# Patient Record
Sex: Female | Born: 1935 | Race: White | Hispanic: No
Health system: Southern US, Community
[De-identification: ages and names within clinical notes are randomized; demographics above are authoritative.]

## PROBLEM LIST (undated history)

## (undated) DIAGNOSIS — I4891 Unspecified atrial fibrillation: Secondary | ICD-10-CM

## (undated) DIAGNOSIS — I10 Essential (primary) hypertension: Secondary | ICD-10-CM

## (undated) DIAGNOSIS — H3530 Unspecified macular degeneration: Secondary | ICD-10-CM

## (undated) DIAGNOSIS — D649 Anemia, unspecified: Secondary | ICD-10-CM

## (undated) HISTORY — PX: MASTECTOMY: SHX3

## (undated) HISTORY — PX: BREAST SURGERY: SHX581

---

## 2014-10-09 ENCOUNTER — Ambulatory Visit: Payer: Self-pay | Admitting: Family

## 2014-11-23 ENCOUNTER — Ambulatory Visit: Payer: Self-pay | Admitting: Family

## 2014-12-02 ENCOUNTER — Ambulatory Visit: Payer: Self-pay | Admitting: Family Medicine

## 2014-12-10 ENCOUNTER — Other Ambulatory Visit: Payer: Self-pay | Admitting: Internal Medicine

## 2014-12-10 DIAGNOSIS — R600 Localized edema: Secondary | ICD-10-CM

## 2014-12-11 ENCOUNTER — Other Ambulatory Visit: Payer: Self-pay

## 2014-12-11 ENCOUNTER — Ambulatory Visit
Admission: RE | Admit: 2014-12-11 | Discharge: 2014-12-11 | Disposition: A | Discharge status: Home / Self Care | Payer: Medicare HMO | Source: Ambulatory Visit | Attending: Internal Medicine | Admitting: Internal Medicine

## 2014-12-11 DIAGNOSIS — R600 Localized edema: Secondary | ICD-10-CM

## 2015-07-05 DIAGNOSIS — Z23 Encounter for immunization: Secondary | ICD-10-CM | POA: Diagnosis not present

## 2015-07-09 DIAGNOSIS — G4730 Sleep apnea, unspecified: Secondary | ICD-10-CM | POA: Diagnosis not present

## 2015-07-09 DIAGNOSIS — I10 Essential (primary) hypertension: Secondary | ICD-10-CM | POA: Diagnosis not present

## 2015-07-09 DIAGNOSIS — E7800 Pure hypercholesterolemia, unspecified: Secondary | ICD-10-CM | POA: Diagnosis not present

## 2015-07-09 DIAGNOSIS — G4733 Obstructive sleep apnea (adult) (pediatric): Secondary | ICD-10-CM | POA: Diagnosis not present

## 2015-07-30 DIAGNOSIS — H353133 Nonexudative age-related macular degeneration, bilateral, advanced atrophic without subfoveal involvement: Secondary | ICD-10-CM | POA: Diagnosis not present

## 2015-07-30 DIAGNOSIS — H43813 Vitreous degeneration, bilateral: Secondary | ICD-10-CM | POA: Diagnosis not present

## 2015-08-06 DIAGNOSIS — G4733 Obstructive sleep apnea (adult) (pediatric): Secondary | ICD-10-CM | POA: Diagnosis not present

## 2015-08-09 DIAGNOSIS — G4730 Sleep apnea, unspecified: Secondary | ICD-10-CM | POA: Diagnosis not present

## 2015-08-09 DIAGNOSIS — I10 Essential (primary) hypertension: Secondary | ICD-10-CM | POA: Diagnosis not present

## 2015-08-09 DIAGNOSIS — G4733 Obstructive sleep apnea (adult) (pediatric): Secondary | ICD-10-CM | POA: Diagnosis not present

## 2015-08-09 DIAGNOSIS — E7800 Pure hypercholesterolemia, unspecified: Secondary | ICD-10-CM | POA: Diagnosis not present

## 2015-09-08 DIAGNOSIS — E7800 Pure hypercholesterolemia, unspecified: Secondary | ICD-10-CM | POA: Diagnosis not present

## 2015-09-08 DIAGNOSIS — I10 Essential (primary) hypertension: Secondary | ICD-10-CM | POA: Diagnosis not present

## 2015-09-08 DIAGNOSIS — G4730 Sleep apnea, unspecified: Secondary | ICD-10-CM | POA: Diagnosis not present

## 2015-09-08 DIAGNOSIS — G4733 Obstructive sleep apnea (adult) (pediatric): Secondary | ICD-10-CM | POA: Diagnosis not present

## 2015-10-06 DIAGNOSIS — H353130 Nonexudative age-related macular degeneration, bilateral, stage unspecified: Secondary | ICD-10-CM | POA: Diagnosis not present

## 2015-10-06 DIAGNOSIS — H5372 Impaired contrast sensitivity: Secondary | ICD-10-CM | POA: Diagnosis not present

## 2015-10-06 DIAGNOSIS — H53419 Scotoma involving central area, unspecified eye: Secondary | ICD-10-CM | POA: Diagnosis not present

## 2015-10-09 DIAGNOSIS — I10 Essential (primary) hypertension: Secondary | ICD-10-CM | POA: Diagnosis not present

## 2015-10-09 DIAGNOSIS — G4730 Sleep apnea, unspecified: Secondary | ICD-10-CM | POA: Diagnosis not present

## 2015-10-09 DIAGNOSIS — E7800 Pure hypercholesterolemia, unspecified: Secondary | ICD-10-CM | POA: Diagnosis not present

## 2015-10-09 DIAGNOSIS — G4733 Obstructive sleep apnea (adult) (pediatric): Secondary | ICD-10-CM | POA: Diagnosis not present

## 2015-10-12 DIAGNOSIS — H353130 Nonexudative age-related macular degeneration, bilateral, stage unspecified: Secondary | ICD-10-CM | POA: Diagnosis not present

## 2015-10-12 DIAGNOSIS — H5372 Impaired contrast sensitivity: Secondary | ICD-10-CM | POA: Diagnosis not present

## 2015-10-12 DIAGNOSIS — H53419 Scotoma involving central area, unspecified eye: Secondary | ICD-10-CM | POA: Diagnosis not present

## 2015-10-27 DIAGNOSIS — H5372 Impaired contrast sensitivity: Secondary | ICD-10-CM | POA: Diagnosis not present

## 2015-10-27 DIAGNOSIS — H353130 Nonexudative age-related macular degeneration, bilateral, stage unspecified: Secondary | ICD-10-CM | POA: Diagnosis not present

## 2015-10-27 DIAGNOSIS — H53419 Scotoma involving central area, unspecified eye: Secondary | ICD-10-CM | POA: Diagnosis not present

## 2015-11-09 DIAGNOSIS — G4733 Obstructive sleep apnea (adult) (pediatric): Secondary | ICD-10-CM | POA: Diagnosis not present

## 2015-11-09 DIAGNOSIS — E7800 Pure hypercholesterolemia, unspecified: Secondary | ICD-10-CM | POA: Diagnosis not present

## 2015-11-09 DIAGNOSIS — I10 Essential (primary) hypertension: Secondary | ICD-10-CM | POA: Diagnosis not present

## 2015-11-09 DIAGNOSIS — G4730 Sleep apnea, unspecified: Secondary | ICD-10-CM | POA: Diagnosis not present

## 2015-11-10 DIAGNOSIS — H5372 Impaired contrast sensitivity: Secondary | ICD-10-CM | POA: Diagnosis not present

## 2015-11-10 DIAGNOSIS — H53419 Scotoma involving central area, unspecified eye: Secondary | ICD-10-CM | POA: Diagnosis not present

## 2015-11-10 DIAGNOSIS — H353130 Nonexudative age-related macular degeneration, bilateral, stage unspecified: Secondary | ICD-10-CM | POA: Diagnosis not present

## 2015-11-15 DIAGNOSIS — H53419 Scotoma involving central area, unspecified eye: Secondary | ICD-10-CM | POA: Diagnosis not present

## 2015-11-15 DIAGNOSIS — H353130 Nonexudative age-related macular degeneration, bilateral, stage unspecified: Secondary | ICD-10-CM | POA: Diagnosis not present

## 2015-11-15 DIAGNOSIS — H5372 Impaired contrast sensitivity: Secondary | ICD-10-CM | POA: Diagnosis not present

## 2015-11-23 DIAGNOSIS — G4733 Obstructive sleep apnea (adult) (pediatric): Secondary | ICD-10-CM | POA: Diagnosis not present

## 2015-11-24 DIAGNOSIS — H5372 Impaired contrast sensitivity: Secondary | ICD-10-CM | POA: Diagnosis not present

## 2015-11-24 DIAGNOSIS — H53419 Scotoma involving central area, unspecified eye: Secondary | ICD-10-CM | POA: Diagnosis not present

## 2015-11-24 DIAGNOSIS — H353130 Nonexudative age-related macular degeneration, bilateral, stage unspecified: Secondary | ICD-10-CM | POA: Diagnosis not present

## 2015-12-07 DIAGNOSIS — E7800 Pure hypercholesterolemia, unspecified: Secondary | ICD-10-CM | POA: Diagnosis not present

## 2015-12-07 DIAGNOSIS — G4733 Obstructive sleep apnea (adult) (pediatric): Secondary | ICD-10-CM | POA: Diagnosis not present

## 2015-12-07 DIAGNOSIS — G4730 Sleep apnea, unspecified: Secondary | ICD-10-CM | POA: Diagnosis not present

## 2015-12-07 DIAGNOSIS — I10 Essential (primary) hypertension: Secondary | ICD-10-CM | POA: Diagnosis not present

## 2015-12-09 DIAGNOSIS — I2510 Atherosclerotic heart disease of native coronary artery without angina pectoris: Secondary | ICD-10-CM | POA: Diagnosis not present

## 2015-12-09 DIAGNOSIS — I5030 Unspecified diastolic (congestive) heart failure: Secondary | ICD-10-CM | POA: Diagnosis not present

## 2015-12-09 DIAGNOSIS — I10 Essential (primary) hypertension: Secondary | ICD-10-CM | POA: Diagnosis not present

## 2015-12-09 DIAGNOSIS — K589 Irritable bowel syndrome without diarrhea: Secondary | ICD-10-CM | POA: Diagnosis not present

## 2015-12-09 DIAGNOSIS — E7800 Pure hypercholesterolemia, unspecified: Secondary | ICD-10-CM | POA: Diagnosis not present

## 2015-12-09 DIAGNOSIS — G4730 Sleep apnea, unspecified: Secondary | ICD-10-CM | POA: Diagnosis not present

## 2015-12-13 DIAGNOSIS — R109 Unspecified abdominal pain: Secondary | ICD-10-CM | POA: Diagnosis not present

## 2015-12-20 DIAGNOSIS — R42 Dizziness and giddiness: Secondary | ICD-10-CM | POA: Diagnosis not present

## 2015-12-20 DIAGNOSIS — I2510 Atherosclerotic heart disease of native coronary artery without angina pectoris: Secondary | ICD-10-CM | POA: Diagnosis not present

## 2015-12-20 DIAGNOSIS — I10 Essential (primary) hypertension: Secondary | ICD-10-CM | POA: Diagnosis not present

## 2015-12-20 DIAGNOSIS — G4730 Sleep apnea, unspecified: Secondary | ICD-10-CM | POA: Diagnosis not present

## 2016-01-07 DIAGNOSIS — I10 Essential (primary) hypertension: Secondary | ICD-10-CM | POA: Diagnosis not present

## 2016-01-07 DIAGNOSIS — E7800 Pure hypercholesterolemia, unspecified: Secondary | ICD-10-CM | POA: Diagnosis not present

## 2016-01-07 DIAGNOSIS — G4730 Sleep apnea, unspecified: Secondary | ICD-10-CM | POA: Diagnosis not present

## 2016-01-07 DIAGNOSIS — G4733 Obstructive sleep apnea (adult) (pediatric): Secondary | ICD-10-CM | POA: Diagnosis not present

## 2016-01-20 DIAGNOSIS — H353133 Nonexudative age-related macular degeneration, bilateral, advanced atrophic without subfoveal involvement: Secondary | ICD-10-CM | POA: Diagnosis not present

## 2016-05-04 DIAGNOSIS — G4733 Obstructive sleep apnea (adult) (pediatric): Secondary | ICD-10-CM | POA: Diagnosis not present

## 2016-06-11 DIAGNOSIS — Z23 Encounter for immunization: Secondary | ICD-10-CM | POA: Diagnosis not present

## 2016-06-15 DIAGNOSIS — I2510 Atherosclerotic heart disease of native coronary artery without angina pectoris: Secondary | ICD-10-CM | POA: Diagnosis not present

## 2016-06-15 DIAGNOSIS — H3530 Unspecified macular degeneration: Secondary | ICD-10-CM | POA: Diagnosis not present

## 2016-06-15 DIAGNOSIS — I5030 Unspecified diastolic (congestive) heart failure: Secondary | ICD-10-CM | POA: Diagnosis not present

## 2016-06-15 DIAGNOSIS — Z1389 Encounter for screening for other disorder: Secondary | ICD-10-CM | POA: Diagnosis not present

## 2016-06-15 DIAGNOSIS — Z0000 Encounter for general adult medical examination without abnormal findings: Secondary | ICD-10-CM | POA: Diagnosis not present

## 2016-06-15 DIAGNOSIS — G4730 Sleep apnea, unspecified: Secondary | ICD-10-CM | POA: Diagnosis not present

## 2016-06-15 DIAGNOSIS — E7800 Pure hypercholesterolemia, unspecified: Secondary | ICD-10-CM | POA: Diagnosis not present

## 2016-06-15 DIAGNOSIS — M8588 Other specified disorders of bone density and structure, other site: Secondary | ICD-10-CM | POA: Diagnosis not present

## 2016-06-15 DIAGNOSIS — I10 Essential (primary) hypertension: Secondary | ICD-10-CM | POA: Diagnosis not present

## 2016-07-04 DIAGNOSIS — H52221 Regular astigmatism, right eye: Secondary | ICD-10-CM | POA: Diagnosis not present

## 2016-07-04 DIAGNOSIS — H35371 Puckering of macula, right eye: Secondary | ICD-10-CM | POA: Diagnosis not present

## 2016-07-04 DIAGNOSIS — H26491 Other secondary cataract, right eye: Secondary | ICD-10-CM | POA: Diagnosis not present

## 2016-07-04 DIAGNOSIS — H52222 Regular astigmatism, left eye: Secondary | ICD-10-CM | POA: Diagnosis not present

## 2016-07-04 DIAGNOSIS — H353134 Nonexudative age-related macular degeneration, bilateral, advanced atrophic with subfoveal involvement: Secondary | ICD-10-CM | POA: Diagnosis not present

## 2016-07-04 DIAGNOSIS — H524 Presbyopia: Secondary | ICD-10-CM | POA: Diagnosis not present

## 2016-07-04 DIAGNOSIS — H04123 Dry eye syndrome of bilateral lacrimal glands: Secondary | ICD-10-CM | POA: Diagnosis not present

## 2016-07-04 DIAGNOSIS — H5211 Myopia, right eye: Secondary | ICD-10-CM | POA: Diagnosis not present

## 2016-07-18 DIAGNOSIS — H353123 Nonexudative age-related macular degeneration, left eye, advanced atrophic without subfoveal involvement: Secondary | ICD-10-CM | POA: Diagnosis not present

## 2016-07-18 DIAGNOSIS — H43392 Other vitreous opacities, left eye: Secondary | ICD-10-CM | POA: Diagnosis not present

## 2016-07-18 DIAGNOSIS — H35433 Paving stone degeneration of retina, bilateral: Secondary | ICD-10-CM | POA: Diagnosis not present

## 2016-07-18 DIAGNOSIS — H353114 Nonexudative age-related macular degeneration, right eye, advanced atrophic with subfoveal involvement: Secondary | ICD-10-CM | POA: Diagnosis not present

## 2016-08-08 DIAGNOSIS — I10 Essential (primary) hypertension: Secondary | ICD-10-CM | POA: Diagnosis not present

## 2016-08-08 DIAGNOSIS — E7800 Pure hypercholesterolemia, unspecified: Secondary | ICD-10-CM | POA: Diagnosis not present

## 2016-08-08 DIAGNOSIS — G4730 Sleep apnea, unspecified: Secondary | ICD-10-CM | POA: Diagnosis not present

## 2016-08-08 DIAGNOSIS — G4733 Obstructive sleep apnea (adult) (pediatric): Secondary | ICD-10-CM | POA: Diagnosis not present

## 2016-11-07 DIAGNOSIS — G4733 Obstructive sleep apnea (adult) (pediatric): Secondary | ICD-10-CM | POA: Diagnosis not present

## 2016-11-13 DIAGNOSIS — I10 Essential (primary) hypertension: Secondary | ICD-10-CM | POA: Diagnosis not present

## 2016-11-13 DIAGNOSIS — J309 Allergic rhinitis, unspecified: Secondary | ICD-10-CM | POA: Diagnosis not present

## 2016-11-13 DIAGNOSIS — H3530 Unspecified macular degeneration: Secondary | ICD-10-CM | POA: Diagnosis not present

## 2016-11-13 DIAGNOSIS — Z0000 Encounter for general adult medical examination without abnormal findings: Secondary | ICD-10-CM | POA: Diagnosis not present

## 2016-11-13 DIAGNOSIS — Z6823 Body mass index (BMI) 23.0-23.9, adult: Secondary | ICD-10-CM | POA: Diagnosis not present

## 2016-11-13 DIAGNOSIS — Z596 Low income: Secondary | ICD-10-CM | POA: Diagnosis not present

## 2016-11-13 DIAGNOSIS — D0592 Unspecified type of carcinoma in situ of left breast: Secondary | ICD-10-CM | POA: Diagnosis not present

## 2016-11-13 DIAGNOSIS — E785 Hyperlipidemia, unspecified: Secondary | ICD-10-CM | POA: Diagnosis not present

## 2016-11-13 DIAGNOSIS — R197 Diarrhea, unspecified: Secondary | ICD-10-CM | POA: Diagnosis not present

## 2016-11-13 DIAGNOSIS — Z7982 Long term (current) use of aspirin: Secondary | ICD-10-CM | POA: Diagnosis not present

## 2017-01-18 DIAGNOSIS — H353123 Nonexudative age-related macular degeneration, left eye, advanced atrophic without subfoveal involvement: Secondary | ICD-10-CM | POA: Diagnosis not present

## 2017-03-08 DIAGNOSIS — E7800 Pure hypercholesterolemia, unspecified: Secondary | ICD-10-CM | POA: Diagnosis not present

## 2017-03-08 DIAGNOSIS — I10 Essential (primary) hypertension: Secondary | ICD-10-CM | POA: Diagnosis not present

## 2017-03-08 DIAGNOSIS — G4733 Obstructive sleep apnea (adult) (pediatric): Secondary | ICD-10-CM | POA: Diagnosis not present

## 2017-03-08 DIAGNOSIS — G4730 Sleep apnea, unspecified: Secondary | ICD-10-CM | POA: Diagnosis not present

## 2017-05-09 DIAGNOSIS — G4733 Obstructive sleep apnea (adult) (pediatric): Secondary | ICD-10-CM | POA: Diagnosis not present

## 2017-06-25 DIAGNOSIS — Z0000 Encounter for general adult medical examination without abnormal findings: Secondary | ICD-10-CM | POA: Diagnosis not present

## 2017-06-25 DIAGNOSIS — Z853 Personal history of malignant neoplasm of breast: Secondary | ICD-10-CM | POA: Diagnosis not present

## 2017-06-25 DIAGNOSIS — M6530 Trigger finger, unspecified finger: Secondary | ICD-10-CM | POA: Diagnosis not present

## 2017-06-25 DIAGNOSIS — I5030 Unspecified diastolic (congestive) heart failure: Secondary | ICD-10-CM | POA: Diagnosis not present

## 2017-06-25 DIAGNOSIS — Z23 Encounter for immunization: Secondary | ICD-10-CM | POA: Diagnosis not present

## 2017-06-25 DIAGNOSIS — G4730 Sleep apnea, unspecified: Secondary | ICD-10-CM | POA: Diagnosis not present

## 2017-06-25 DIAGNOSIS — I10 Essential (primary) hypertension: Secondary | ICD-10-CM | POA: Diagnosis not present

## 2017-06-25 DIAGNOSIS — Z1389 Encounter for screening for other disorder: Secondary | ICD-10-CM | POA: Diagnosis not present

## 2017-06-25 DIAGNOSIS — E7800 Pure hypercholesterolemia, unspecified: Secondary | ICD-10-CM | POA: Diagnosis not present

## 2017-06-25 DIAGNOSIS — I2510 Atherosclerotic heart disease of native coronary artery without angina pectoris: Secondary | ICD-10-CM | POA: Diagnosis not present

## 2017-06-28 DIAGNOSIS — M65342 Trigger finger, left ring finger: Secondary | ICD-10-CM | POA: Diagnosis not present

## 2017-06-28 DIAGNOSIS — M79642 Pain in left hand: Secondary | ICD-10-CM | POA: Diagnosis not present

## 2017-07-11 DIAGNOSIS — M65342 Trigger finger, left ring finger: Secondary | ICD-10-CM | POA: Diagnosis not present

## 2017-07-17 DIAGNOSIS — H353133 Nonexudative age-related macular degeneration, bilateral, advanced atrophic without subfoveal involvement: Secondary | ICD-10-CM | POA: Diagnosis not present

## 2017-07-17 DIAGNOSIS — H43813 Vitreous degeneration, bilateral: Secondary | ICD-10-CM | POA: Diagnosis not present

## 2017-07-17 DIAGNOSIS — H43393 Other vitreous opacities, bilateral: Secondary | ICD-10-CM | POA: Diagnosis not present

## 2017-07-17 DIAGNOSIS — H35433 Paving stone degeneration of retina, bilateral: Secondary | ICD-10-CM | POA: Diagnosis not present

## 2017-09-07 DIAGNOSIS — G4730 Sleep apnea, unspecified: Secondary | ICD-10-CM | POA: Diagnosis not present

## 2017-09-07 DIAGNOSIS — I10 Essential (primary) hypertension: Secondary | ICD-10-CM | POA: Diagnosis not present

## 2017-09-07 DIAGNOSIS — E7800 Pure hypercholesterolemia, unspecified: Secondary | ICD-10-CM | POA: Diagnosis not present

## 2017-09-07 DIAGNOSIS — G4733 Obstructive sleep apnea (adult) (pediatric): Secondary | ICD-10-CM | POA: Diagnosis not present

## 2017-11-13 DIAGNOSIS — G4733 Obstructive sleep apnea (adult) (pediatric): Secondary | ICD-10-CM | POA: Diagnosis not present

## 2018-01-08 DIAGNOSIS — H26491 Other secondary cataract, right eye: Secondary | ICD-10-CM | POA: Diagnosis not present

## 2018-01-08 DIAGNOSIS — H35371 Puckering of macula, right eye: Secondary | ICD-10-CM | POA: Diagnosis not present

## 2018-01-08 DIAGNOSIS — H353134 Nonexudative age-related macular degeneration, bilateral, advanced atrophic with subfoveal involvement: Secondary | ICD-10-CM | POA: Diagnosis not present

## 2018-01-08 DIAGNOSIS — H52221 Regular astigmatism, right eye: Secondary | ICD-10-CM | POA: Diagnosis not present

## 2018-01-08 DIAGNOSIS — H04123 Dry eye syndrome of bilateral lacrimal glands: Secondary | ICD-10-CM | POA: Diagnosis not present

## 2018-01-08 DIAGNOSIS — H524 Presbyopia: Secondary | ICD-10-CM | POA: Diagnosis not present

## 2018-01-08 DIAGNOSIS — H5211 Myopia, right eye: Secondary | ICD-10-CM | POA: Diagnosis not present

## 2018-01-08 DIAGNOSIS — H52222 Regular astigmatism, left eye: Secondary | ICD-10-CM | POA: Diagnosis not present

## 2018-01-16 ENCOUNTER — Other Ambulatory Visit: Payer: Self-pay | Admitting: Internal Medicine

## 2018-01-16 DIAGNOSIS — I5030 Unspecified diastolic (congestive) heart failure: Secondary | ICD-10-CM | POA: Diagnosis not present

## 2018-01-16 DIAGNOSIS — I779 Disorder of arteries and arterioles, unspecified: Secondary | ICD-10-CM | POA: Diagnosis not present

## 2018-01-16 DIAGNOSIS — I10 Essential (primary) hypertension: Secondary | ICD-10-CM | POA: Diagnosis not present

## 2018-01-16 DIAGNOSIS — I2510 Atherosclerotic heart disease of native coronary artery without angina pectoris: Secondary | ICD-10-CM | POA: Diagnosis not present

## 2018-01-16 DIAGNOSIS — G4730 Sleep apnea, unspecified: Secondary | ICD-10-CM | POA: Diagnosis not present

## 2018-01-16 DIAGNOSIS — E7800 Pure hypercholesterolemia, unspecified: Secondary | ICD-10-CM | POA: Diagnosis not present

## 2018-01-16 DIAGNOSIS — I739 Peripheral vascular disease, unspecified: Principal | ICD-10-CM

## 2018-01-24 ENCOUNTER — Ambulatory Visit
Admission: RE | Admit: 2018-01-24 | Discharge: 2018-01-24 | Disposition: A | Discharge status: Home / Self Care | Payer: Medicare HMO | Source: Ambulatory Visit | Attending: Internal Medicine | Admitting: Internal Medicine

## 2018-01-24 DIAGNOSIS — I739 Peripheral vascular disease, unspecified: Principal | ICD-10-CM

## 2018-01-24 DIAGNOSIS — I779 Disorder of arteries and arterioles, unspecified: Secondary | ICD-10-CM

## 2018-01-24 DIAGNOSIS — I6523 Occlusion and stenosis of bilateral carotid arteries: Secondary | ICD-10-CM | POA: Diagnosis not present

## 2018-03-28 DIAGNOSIS — H6121 Impacted cerumen, right ear: Secondary | ICD-10-CM | POA: Diagnosis not present

## 2018-05-14 DIAGNOSIS — G4733 Obstructive sleep apnea (adult) (pediatric): Secondary | ICD-10-CM | POA: Diagnosis not present

## 2018-07-09 DIAGNOSIS — H35371 Puckering of macula, right eye: Secondary | ICD-10-CM | POA: Diagnosis not present

## 2018-07-09 DIAGNOSIS — H04123 Dry eye syndrome of bilateral lacrimal glands: Secondary | ICD-10-CM | POA: Diagnosis not present

## 2018-07-09 DIAGNOSIS — H353134 Nonexudative age-related macular degeneration, bilateral, advanced atrophic with subfoveal involvement: Secondary | ICD-10-CM | POA: Diagnosis not present

## 2018-07-09 DIAGNOSIS — H26491 Other secondary cataract, right eye: Secondary | ICD-10-CM | POA: Diagnosis not present

## 2018-07-16 DIAGNOSIS — H353133 Nonexudative age-related macular degeneration, bilateral, advanced atrophic without subfoveal involvement: Secondary | ICD-10-CM | POA: Diagnosis not present

## 2018-07-16 DIAGNOSIS — H43393 Other vitreous opacities, bilateral: Secondary | ICD-10-CM | POA: Diagnosis not present

## 2018-07-16 DIAGNOSIS — H35433 Paving stone degeneration of retina, bilateral: Secondary | ICD-10-CM | POA: Diagnosis not present

## 2018-07-16 DIAGNOSIS — H43813 Vitreous degeneration, bilateral: Secondary | ICD-10-CM | POA: Diagnosis not present

## 2018-07-26 DIAGNOSIS — I10 Essential (primary) hypertension: Secondary | ICD-10-CM | POA: Diagnosis not present

## 2018-07-26 DIAGNOSIS — Z0000 Encounter for general adult medical examination without abnormal findings: Secondary | ICD-10-CM | POA: Diagnosis not present

## 2018-07-26 DIAGNOSIS — I779 Disorder of arteries and arterioles, unspecified: Secondary | ICD-10-CM | POA: Diagnosis not present

## 2018-07-26 DIAGNOSIS — Z853 Personal history of malignant neoplasm of breast: Secondary | ICD-10-CM | POA: Diagnosis not present

## 2018-07-26 DIAGNOSIS — E7800 Pure hypercholesterolemia, unspecified: Secondary | ICD-10-CM | POA: Diagnosis not present

## 2018-07-26 DIAGNOSIS — H3530 Unspecified macular degeneration: Secondary | ICD-10-CM | POA: Diagnosis not present

## 2018-07-26 DIAGNOSIS — Z23 Encounter for immunization: Secondary | ICD-10-CM | POA: Diagnosis not present

## 2018-07-26 DIAGNOSIS — R5383 Other fatigue: Secondary | ICD-10-CM | POA: Diagnosis not present

## 2018-07-26 DIAGNOSIS — D649 Anemia, unspecified: Secondary | ICD-10-CM | POA: Diagnosis not present

## 2018-07-26 DIAGNOSIS — Z1389 Encounter for screening for other disorder: Secondary | ICD-10-CM | POA: Diagnosis not present

## 2018-08-21 DIAGNOSIS — D649 Anemia, unspecified: Secondary | ICD-10-CM | POA: Diagnosis not present

## 2018-09-03 DIAGNOSIS — D649 Anemia, unspecified: Secondary | ICD-10-CM | POA: Diagnosis not present

## 2018-09-11 DIAGNOSIS — G4733 Obstructive sleep apnea (adult) (pediatric): Secondary | ICD-10-CM | POA: Diagnosis not present

## 2018-10-12 DIAGNOSIS — G4733 Obstructive sleep apnea (adult) (pediatric): Secondary | ICD-10-CM | POA: Diagnosis not present

## 2018-11-06 DIAGNOSIS — K921 Melena: Secondary | ICD-10-CM | POA: Diagnosis not present

## 2018-11-06 DIAGNOSIS — D500 Iron deficiency anemia secondary to blood loss (chronic): Secondary | ICD-10-CM | POA: Diagnosis not present

## 2018-11-06 DIAGNOSIS — R634 Abnormal weight loss: Secondary | ICD-10-CM | POA: Diagnosis not present

## 2018-11-12 DIAGNOSIS — G4733 Obstructive sleep apnea (adult) (pediatric): Secondary | ICD-10-CM | POA: Diagnosis not present

## 2018-11-21 DIAGNOSIS — K921 Melena: Secondary | ICD-10-CM | POA: Diagnosis not present

## 2018-11-21 DIAGNOSIS — R634 Abnormal weight loss: Secondary | ICD-10-CM | POA: Diagnosis not present

## 2018-11-28 DIAGNOSIS — Z853 Personal history of malignant neoplasm of breast: Secondary | ICD-10-CM | POA: Diagnosis not present

## 2018-11-28 DIAGNOSIS — I2510 Atherosclerotic heart disease of native coronary artery without angina pectoris: Secondary | ICD-10-CM | POA: Diagnosis not present

## 2018-11-28 DIAGNOSIS — Z800 Family history of malignant neoplasm of digestive organs: Secondary | ICD-10-CM | POA: Diagnosis not present

## 2018-11-28 DIAGNOSIS — G4730 Sleep apnea, unspecified: Secondary | ICD-10-CM | POA: Diagnosis not present

## 2018-11-28 DIAGNOSIS — Z86010 Personal history of colonic polyps: Secondary | ICD-10-CM | POA: Diagnosis not present

## 2018-11-28 DIAGNOSIS — K921 Melena: Secondary | ICD-10-CM | POA: Diagnosis not present

## 2018-11-28 DIAGNOSIS — D500 Iron deficiency anemia secondary to blood loss (chronic): Secondary | ICD-10-CM | POA: Diagnosis not present

## 2018-11-28 DIAGNOSIS — R634 Abnormal weight loss: Secondary | ICD-10-CM | POA: Diagnosis not present

## 2018-12-11 DIAGNOSIS — G4733 Obstructive sleep apnea (adult) (pediatric): Secondary | ICD-10-CM | POA: Diagnosis not present

## 2019-01-11 DIAGNOSIS — G4733 Obstructive sleep apnea (adult) (pediatric): Secondary | ICD-10-CM | POA: Diagnosis not present

## 2019-02-04 DIAGNOSIS — H353133 Nonexudative age-related macular degeneration, bilateral, advanced atrophic without subfoveal involvement: Secondary | ICD-10-CM | POA: Diagnosis not present

## 2019-02-10 DIAGNOSIS — G4733 Obstructive sleep apnea (adult) (pediatric): Secondary | ICD-10-CM | POA: Diagnosis not present

## 2019-03-13 DIAGNOSIS — I10 Essential (primary) hypertension: Secondary | ICD-10-CM | POA: Diagnosis not present

## 2019-03-13 DIAGNOSIS — D500 Iron deficiency anemia secondary to blood loss (chronic): Secondary | ICD-10-CM | POA: Diagnosis not present

## 2019-03-13 DIAGNOSIS — G4733 Obstructive sleep apnea (adult) (pediatric): Secondary | ICD-10-CM | POA: Diagnosis not present

## 2019-03-13 DIAGNOSIS — I2510 Atherosclerotic heart disease of native coronary artery without angina pectoris: Secondary | ICD-10-CM | POA: Diagnosis not present

## 2019-03-13 DIAGNOSIS — E7800 Pure hypercholesterolemia, unspecified: Secondary | ICD-10-CM | POA: Diagnosis not present

## 2019-03-14 DIAGNOSIS — G4733 Obstructive sleep apnea (adult) (pediatric): Secondary | ICD-10-CM | POA: Diagnosis not present

## 2019-04-12 DIAGNOSIS — G4733 Obstructive sleep apnea (adult) (pediatric): Secondary | ICD-10-CM | POA: Diagnosis not present

## 2019-04-15 DIAGNOSIS — D649 Anemia, unspecified: Secondary | ICD-10-CM | POA: Diagnosis not present

## 2019-05-13 DIAGNOSIS — G4733 Obstructive sleep apnea (adult) (pediatric): Secondary | ICD-10-CM | POA: Diagnosis not present

## 2019-06-13 DIAGNOSIS — G4733 Obstructive sleep apnea (adult) (pediatric): Secondary | ICD-10-CM | POA: Diagnosis not present

## 2019-06-17 DIAGNOSIS — H5211 Myopia, right eye: Secondary | ICD-10-CM | POA: Diagnosis not present

## 2019-06-17 DIAGNOSIS — H52221 Regular astigmatism, right eye: Secondary | ICD-10-CM | POA: Diagnosis not present

## 2019-06-17 DIAGNOSIS — H26491 Other secondary cataract, right eye: Secondary | ICD-10-CM | POA: Diagnosis not present

## 2019-06-17 DIAGNOSIS — H35371 Puckering of macula, right eye: Secondary | ICD-10-CM | POA: Diagnosis not present

## 2019-06-17 DIAGNOSIS — H52222 Regular astigmatism, left eye: Secondary | ICD-10-CM | POA: Diagnosis not present

## 2019-06-17 DIAGNOSIS — H04123 Dry eye syndrome of bilateral lacrimal glands: Secondary | ICD-10-CM | POA: Diagnosis not present

## 2019-06-17 DIAGNOSIS — H524 Presbyopia: Secondary | ICD-10-CM | POA: Diagnosis not present

## 2019-06-17 DIAGNOSIS — H353134 Nonexudative age-related macular degeneration, bilateral, advanced atrophic with subfoveal involvement: Secondary | ICD-10-CM | POA: Diagnosis not present

## 2019-06-24 DIAGNOSIS — H43393 Other vitreous opacities, bilateral: Secondary | ICD-10-CM | POA: Diagnosis not present

## 2019-06-24 DIAGNOSIS — H353134 Nonexudative age-related macular degeneration, bilateral, advanced atrophic with subfoveal involvement: Secondary | ICD-10-CM | POA: Diagnosis not present

## 2019-06-24 DIAGNOSIS — H43813 Vitreous degeneration, bilateral: Secondary | ICD-10-CM | POA: Diagnosis not present

## 2019-07-13 DIAGNOSIS — G4733 Obstructive sleep apnea (adult) (pediatric): Secondary | ICD-10-CM | POA: Diagnosis not present

## 2019-07-17 DIAGNOSIS — Z23 Encounter for immunization: Secondary | ICD-10-CM | POA: Diagnosis not present

## 2019-07-18 ENCOUNTER — Other Ambulatory Visit: Payer: Self-pay | Admitting: Internal Medicine

## 2019-07-18 DIAGNOSIS — G4730 Sleep apnea, unspecified: Secondary | ICD-10-CM | POA: Diagnosis not present

## 2019-07-18 DIAGNOSIS — E7800 Pure hypercholesterolemia, unspecified: Secondary | ICD-10-CM | POA: Diagnosis not present

## 2019-07-18 DIAGNOSIS — R600 Localized edema: Secondary | ICD-10-CM | POA: Diagnosis not present

## 2019-07-18 DIAGNOSIS — I779 Disorder of arteries and arterioles, unspecified: Secondary | ICD-10-CM | POA: Diagnosis not present

## 2019-07-18 DIAGNOSIS — D500 Iron deficiency anemia secondary to blood loss (chronic): Secondary | ICD-10-CM | POA: Diagnosis not present

## 2019-07-18 DIAGNOSIS — Z853 Personal history of malignant neoplasm of breast: Secondary | ICD-10-CM | POA: Diagnosis not present

## 2019-07-18 DIAGNOSIS — I10 Essential (primary) hypertension: Secondary | ICD-10-CM | POA: Diagnosis not present

## 2019-07-23 ENCOUNTER — Ambulatory Visit
Admission: RE | Admit: 2019-07-23 | Discharge: 2019-07-23 | Disposition: A | Discharge status: Home / Self Care | Payer: Medicare HMO | Source: Ambulatory Visit | Attending: Internal Medicine | Admitting: Internal Medicine

## 2019-07-23 DIAGNOSIS — I6523 Occlusion and stenosis of bilateral carotid arteries: Secondary | ICD-10-CM | POA: Diagnosis not present

## 2019-07-23 DIAGNOSIS — I779 Disorder of arteries and arterioles, unspecified: Secondary | ICD-10-CM

## 2019-08-13 DIAGNOSIS — G4733 Obstructive sleep apnea (adult) (pediatric): Secondary | ICD-10-CM | POA: Diagnosis not present

## 2019-09-15 DIAGNOSIS — G4733 Obstructive sleep apnea (adult) (pediatric): Secondary | ICD-10-CM | POA: Diagnosis not present

## 2019-09-30 DIAGNOSIS — H35373 Puckering of macula, bilateral: Secondary | ICD-10-CM | POA: Diagnosis not present

## 2019-09-30 DIAGNOSIS — H43813 Vitreous degeneration, bilateral: Secondary | ICD-10-CM | POA: Diagnosis not present

## 2019-09-30 DIAGNOSIS — H353134 Nonexudative age-related macular degeneration, bilateral, advanced atrophic with subfoveal involvement: Secondary | ICD-10-CM | POA: Diagnosis not present

## 2019-10-20 DIAGNOSIS — Z23 Encounter for immunization: Secondary | ICD-10-CM | POA: Diagnosis not present

## 2020-01-06 DIAGNOSIS — H35433 Paving stone degeneration of retina, bilateral: Secondary | ICD-10-CM | POA: Diagnosis not present

## 2020-01-06 DIAGNOSIS — H35423 Microcystoid degeneration of retina, bilateral: Secondary | ICD-10-CM | POA: Diagnosis not present

## 2020-01-06 DIAGNOSIS — H35373 Puckering of macula, bilateral: Secondary | ICD-10-CM | POA: Diagnosis not present

## 2020-01-06 DIAGNOSIS — H353134 Nonexudative age-related macular degeneration, bilateral, advanced atrophic with subfoveal involvement: Secondary | ICD-10-CM | POA: Diagnosis not present

## 2020-01-14 DIAGNOSIS — Z1159 Encounter for screening for other viral diseases: Secondary | ICD-10-CM | POA: Diagnosis not present

## 2020-01-14 DIAGNOSIS — Z20828 Contact with and (suspected) exposure to other viral communicable diseases: Secondary | ICD-10-CM | POA: Diagnosis not present

## 2020-01-15 DIAGNOSIS — R195 Other fecal abnormalities: Secondary | ICD-10-CM | POA: Diagnosis not present

## 2020-01-15 DIAGNOSIS — I10 Essential (primary) hypertension: Secondary | ICD-10-CM | POA: Diagnosis not present

## 2020-01-15 DIAGNOSIS — Z7189 Other specified counseling: Secondary | ICD-10-CM | POA: Diagnosis not present

## 2020-01-15 DIAGNOSIS — R55 Syncope and collapse: Secondary | ICD-10-CM | POA: Diagnosis not present

## 2020-01-15 DIAGNOSIS — D500 Iron deficiency anemia secondary to blood loss (chronic): Secondary | ICD-10-CM | POA: Diagnosis not present

## 2020-02-04 DIAGNOSIS — Z20828 Contact with and (suspected) exposure to other viral communicable diseases: Secondary | ICD-10-CM | POA: Diagnosis not present

## 2020-02-04 DIAGNOSIS — Z1159 Encounter for screening for other viral diseases: Secondary | ICD-10-CM | POA: Diagnosis not present

## 2020-02-11 DIAGNOSIS — Z20828 Contact with and (suspected) exposure to other viral communicable diseases: Secondary | ICD-10-CM | POA: Diagnosis not present

## 2020-02-11 DIAGNOSIS — Z1159 Encounter for screening for other viral diseases: Secondary | ICD-10-CM | POA: Diagnosis not present

## 2020-02-18 DIAGNOSIS — Z20828 Contact with and (suspected) exposure to other viral communicable diseases: Secondary | ICD-10-CM | POA: Diagnosis not present

## 2020-02-18 DIAGNOSIS — Z1159 Encounter for screening for other viral diseases: Secondary | ICD-10-CM | POA: Diagnosis not present

## 2020-03-03 DIAGNOSIS — Z1159 Encounter for screening for other viral diseases: Secondary | ICD-10-CM | POA: Diagnosis not present

## 2020-03-03 DIAGNOSIS — Z20828 Contact with and (suspected) exposure to other viral communicable diseases: Secondary | ICD-10-CM | POA: Diagnosis not present

## 2020-03-10 DIAGNOSIS — Z20828 Contact with and (suspected) exposure to other viral communicable diseases: Secondary | ICD-10-CM | POA: Diagnosis not present

## 2020-03-10 DIAGNOSIS — Z1159 Encounter for screening for other viral diseases: Secondary | ICD-10-CM | POA: Diagnosis not present

## 2020-03-16 DIAGNOSIS — G4733 Obstructive sleep apnea (adult) (pediatric): Secondary | ICD-10-CM | POA: Diagnosis not present

## 2020-03-17 DIAGNOSIS — Z1159 Encounter for screening for other viral diseases: Secondary | ICD-10-CM | POA: Diagnosis not present

## 2020-03-17 DIAGNOSIS — Z20828 Contact with and (suspected) exposure to other viral communicable diseases: Secondary | ICD-10-CM | POA: Diagnosis not present

## 2020-03-24 DIAGNOSIS — Z20828 Contact with and (suspected) exposure to other viral communicable diseases: Secondary | ICD-10-CM | POA: Diagnosis not present

## 2020-03-24 DIAGNOSIS — Z1159 Encounter for screening for other viral diseases: Secondary | ICD-10-CM | POA: Diagnosis not present

## 2020-03-31 DIAGNOSIS — Z1159 Encounter for screening for other viral diseases: Secondary | ICD-10-CM | POA: Diagnosis not present

## 2020-03-31 DIAGNOSIS — Z20828 Contact with and (suspected) exposure to other viral communicable diseases: Secondary | ICD-10-CM | POA: Diagnosis not present

## 2020-04-01 ENCOUNTER — Emergency Department (HOSPITAL_COMMUNITY)
Admission: EM | Admit: 2020-04-01 | Discharge: 2020-04-01 | Disposition: A | Discharge status: Home / Self Care | Attending: Emergency Medicine | Admitting: Emergency Medicine

## 2020-04-01 ENCOUNTER — Emergency Department (HOSPITAL_COMMUNITY)

## 2020-04-01 ENCOUNTER — Other Ambulatory Visit: Payer: Self-pay

## 2020-04-01 ENCOUNTER — Encounter (HOSPITAL_COMMUNITY): Payer: Self-pay

## 2020-04-01 DIAGNOSIS — R220 Localized swelling, mass and lump, head: Secondary | ICD-10-CM | POA: Diagnosis not present

## 2020-04-01 DIAGNOSIS — I10 Essential (primary) hypertension: Secondary | ICD-10-CM | POA: Diagnosis not present

## 2020-04-01 DIAGNOSIS — S0003XA Contusion of scalp, initial encounter: Secondary | ICD-10-CM | POA: Diagnosis not present

## 2020-04-01 DIAGNOSIS — W19XXXA Unspecified fall, initial encounter: Secondary | ICD-10-CM | POA: Diagnosis not present

## 2020-04-01 DIAGNOSIS — S0990XA Unspecified injury of head, initial encounter: Secondary | ICD-10-CM | POA: Diagnosis not present

## 2020-04-01 DIAGNOSIS — Y9389 Activity, other specified: Secondary | ICD-10-CM | POA: Insufficient documentation

## 2020-04-01 DIAGNOSIS — Y9289 Other specified places as the place of occurrence of the external cause: Secondary | ICD-10-CM | POA: Diagnosis not present

## 2020-04-01 DIAGNOSIS — J3489 Other specified disorders of nose and nasal sinuses: Secondary | ICD-10-CM | POA: Diagnosis not present

## 2020-04-01 DIAGNOSIS — J320 Chronic maxillary sinusitis: Secondary | ICD-10-CM | POA: Diagnosis not present

## 2020-04-01 DIAGNOSIS — S0093XA Contusion of unspecified part of head, initial encounter: Secondary | ICD-10-CM | POA: Diagnosis not present

## 2020-04-01 DIAGNOSIS — I4891 Unspecified atrial fibrillation: Secondary | ICD-10-CM | POA: Insufficient documentation

## 2020-04-01 DIAGNOSIS — Y998 Other external cause status: Secondary | ICD-10-CM | POA: Diagnosis not present

## 2020-04-01 DIAGNOSIS — S199XXA Unspecified injury of neck, initial encounter: Secondary | ICD-10-CM | POA: Diagnosis not present

## 2020-04-01 HISTORY — DX: Essential (primary) hypertension: I10

## 2020-04-01 HISTORY — DX: Unspecified macular degeneration: H35.30

## 2020-04-01 HISTORY — DX: Unspecified atrial fibrillation: I48.91

## 2020-04-01 HISTORY — DX: Anemia, unspecified: D64.9

## 2020-04-01 LAB — CBC WITH DIFFERENTIAL/PLATELET
Abs Immature Granulocytes: 0.1 10*3/uL — ABNORMAL HIGH (ref 0.00–0.07)
Basophils Absolute: 0 10*3/uL (ref 0.0–0.1)
Basophils Relative: 0 %
Eosinophils Absolute: 0 10*3/uL (ref 0.0–0.5)
Eosinophils Relative: 0 %
HCT: 24.4 % — ABNORMAL LOW (ref 36.0–46.0)
Hemoglobin: 7.3 g/dL — ABNORMAL LOW (ref 12.0–15.0)
Immature Granulocytes: 1 %
Lymphocytes Relative: 3 %
Lymphs Abs: 0.3 10*3/uL — ABNORMAL LOW (ref 0.7–4.0)
MCH: 26.1 pg (ref 26.0–34.0)
MCHC: 29.9 g/dL — ABNORMAL LOW (ref 30.0–36.0)
MCV: 87.1 fL (ref 80.0–100.0)
Monocytes Absolute: 0.9 10*3/uL (ref 0.1–1.0)
Monocytes Relative: 9 %
Neutro Abs: 9.1 10*3/uL — ABNORMAL HIGH (ref 1.7–7.7)
Neutrophils Relative %: 87 %
Platelets: 351 10*3/uL (ref 150–400)
RBC: 2.8 MIL/uL — ABNORMAL LOW (ref 3.87–5.11)
RDW: 14.6 % (ref 11.5–15.5)
WBC: 10.5 10*3/uL (ref 4.0–10.5)
nRBC: 0 % (ref 0.0–0.2)

## 2020-04-01 LAB — BASIC METABOLIC PANEL
Anion gap: 11 (ref 5–15)
BUN: 17 mg/dL (ref 8–23)
CO2: 20 mmol/L — ABNORMAL LOW (ref 22–32)
Calcium: 8.2 mg/dL — ABNORMAL LOW (ref 8.9–10.3)
Chloride: 99 mmol/L (ref 98–111)
Creatinine, Ser: 0.52 mg/dL (ref 0.44–1.00)
GFR calc Af Amer: >60 mL/min (ref >60)
GFR calc non Af Amer: >60 mL/min (ref >60)
Glucose, Bld: 117 mg/dL — ABNORMAL HIGH (ref 70–99)
Potassium: 4.2 mmol/L (ref 3.5–5.1)
Sodium: 130 mmol/L — ABNORMAL LOW (ref 135–145)

## 2020-04-01 MED ORDER — METOPROLOL SUCCINATE ER 50 MG PO TB24
50.0000 mg | ORAL_TABLET | Freq: Every day | ORAL | Status: DC
  Administered 2020-04-01: 50 mg via ORAL
  Filled 2020-04-01: qty 1

## 2020-04-01 MED ORDER — METOPROLOL TARTRATE 5 MG/5ML IV SOLN
5.0000 mg | Freq: Once | INTRAVENOUS | Status: AC
  Administered 2020-04-01: 5 mg via INTRAVENOUS
  Filled 2020-04-01: qty 5

## 2020-04-01 NOTE — ED Triage Notes (Addendum)
Pt arrived via EMS, per EMS pt was out having lunch with family, went to sit on her walker, brakes were not on and she fell to the ground striking her head. No LOC, no blood thinners. Small area of bleeding to back of the head, bleeding controlled. C collar in place

## 2020-04-01 NOTE — Progress Notes (Signed)
[employer], [employer] [facility]) hospital liaison note  Ms Magurder is an existing patient with [facility] hosp[facility] who lives in an independent living apartment at [facility]. Per recent hospice RN notes she is AOx4 at baseline with moments of forgetfulness, ambulates with a rolling walker with unsteady gait.  No recent acute changes noted.  Please feel free to call with any questions or concerns.  Thank you for the opportunity to participate in this patient's care.  Gillian Scarce, BSN, RN [facility] hospice liaison [phone] (24h on call)

## 2020-04-01 NOTE — ED Provider Notes (Signed)
Emergency Department Provider Note   I have reviewed the triage vital signs and the nursing notes.   HISTORY  Chief Complaint Fall   HPI Daisy Daniels is a 86 y.o. female with PMH of HTN and A-fib presents to the ED after a mechanical fall while out to lunch with family.  Patient was sitting in a rolling walker but the brakes were not activated.  She fell backwards striking the back of her head on the brick surface.  There was no loss of consciousness per family on scene.  Patient is not anticoagulated.  EMS noted a small area of bleeding to the back of the head which is controlled.  They placed a c-collar and transported her to the emergency department.  Patient denies any chest pain, palpitations, shortness of breath.  She is not experiencing lightheadedness.  No pain in the arms or legs.  No vision changes.  Patient is unsure regarding her A. fib history but states she was taken off of her metoprolol several weeks ago and notes that she is under hospice care at this time.    Past Medical History:  Diagnosis Date  . Anemia   . Atrial fibrillation (HCC)   . Hypertension    no longer on medication  . Macular degeneration     There are no problems to display for this patient.   Past Surgical History:  Procedure Laterality Date  . BREAST SURGERY    . MASTECTOMY      Allergies Penicillin g and Sulfamethoxazole  History reviewed. No pertinent family history.  Social History Social History   Tobacco Use  . Smoking status: Not on file  Substance Use Topics  . Alcohol use: Not on file  . Drug use: Not on file    Review of Systems  Constitutional: No fever/chills Eyes: No visual changes. ENT: No sore throat. Cardiovascular: Denies chest pain. Respiratory: Denies shortness of breath. Gastrointestinal: No abdominal pain.  No nausea, no vomiting.  No diarrhea.  No constipation. Genitourinary: Negative for dysuria. Musculoskeletal: Negative for back pain. Skin:  Negative for rash. Positive posterior scalp abrasion/bleeding.  Neurological: Negative for focal weakness or numbness. Positive HA.   10-point ROS otherwise negative.  ____________________________________________   PHYSICAL EXAM:  VITAL SIGNS: ED Triage Vitals  Enc Vitals Group     BP 04/01/20 1347 (!) 147/61     Pulse Rate 04/01/20 1347 (!) 114     Resp 04/01/20 1347 (!) 22     Temp 04/01/20 1347 99.2 F (37.3 C)     Temp Source 04/01/20 1347 Oral     SpO2 04/01/20 1347 98 %     Weight 04/01/20 1346 102 lb (46.3 kg)     Height 04/01/20 1346 5' (1.524 m)   Constitutional: Alert and oriented. Well appearing and in no acute distress. Eyes: Conjunctivae are normal. PERRL. Head: Atraumatic. Nose: No congestion/rhinnorhea. Mouth/Throat: Mucous membranes are moist.  Oropharynx non-erythematous. Neck: No stridor. C-collar in place.  Cardiovascular: A-fib with RVR. Good peripheral circulation. Grossly normal heart sounds.   Respiratory: Normal respiratory effort.  No retractions. Lungs CTAB. Gastrointestinal: Soft and nontender. No distention.  Musculoskeletal: No gross deformities of extremities. Normal ROM of the bilateral upper and lower extremities.  Neurologic:  Normal speech and language.  Skin:  Skin is warm, dry and intact. No rash noted.   ____________________________________________   LABS (all labs ordered are listed, but only abnormal results are displayed)  Labs Reviewed  BASIC METABOLIC PANEL - Abnormal;  Notable for the following components:      Result Value   Sodium 130 (*)    CO2 20 (*)    Glucose, Bld 117 (*)    Calcium 8.2 (*)    All other components within normal limits  CBC WITH DIFFERENTIAL/PLATELET - Abnormal; Notable for the following components:   RBC 2.80 (*)    Hemoglobin 7.3 (*)    HCT 24.4 (*)    MCHC 29.9 (*)    Neutro Abs 9.1 (*)    Lymphs Abs 0.3 (*)    Abs Immature Granulocytes 0.10 (*)    All other components within normal limits    ____________________________________________  EKG   EKG Interpretation  Date/Time:  Thursday April 01 2020 14:51:26 EDT Ventricular Rate:  128 PR Interval:    QRS Duration: 97 QT Interval:  302 QTC Calculation: 441 R Axis:   102 Text Interpretation: Sinus tachycardia Probable left atrial enlargement Right axis deviation Borderline T wave abnormalities No old tracing to compare Confirmed by Lorre Nick ([id number]) on 04/02/2020 8:37:20 AM       ____________________________________________  RADIOLOGY  CT head and c-spine reviewed.  ____________________________________________   PROCEDURES  Procedure(s) performed:   Procedures  None ____________________________________________   INITIAL IMPRESSION / ASSESSMENT AND PLAN / ED COURSE  Pertinent labs & imaging results that were available during my care of the patient were reviewed by me and considered in my medical decision making (see chart for details).   Patient presents to the emergency department for evaluation of fall with head injury.  There is some dried blood to the posterior scalp without obvious laceration.  We will clean the area and search further.  Patient will need CT imaging of the head and cervical spine.  Normal range of motion of the remaining extremities.  Patient does have A. fib with RVR on monitor and notes that she has been taken off of all of her medicines and currently is on hospice.  Family in route to the emergency department.  We will confirm this with them and discuss further attempts at rate control versus continuing to hold medications.   CT imaging and labs reviewed. Rate slowed with Metoprolol here. Patient to call PCP today/tomorrow to discuss re-starting these meds vs not. Discussed ED return precautions in detail.  ____________________________________________  FINAL CLINICAL IMPRESSION(S) / ED DIAGNOSES  Final diagnoses:  Fall, initial encounter  Contusion of scalp, initial encounter   Atrial fibrillation with RVR (HCC)     MEDICATIONS GIVEN DURING THIS VISIT:  Medications  metoprolol tartrate (LOPRESSOR) injection 5 mg (5 mg Intravenous Given 04/01/20 1458)    Note:  This document was prepared using Dragon voice recognition software and may include unintentional dictation errors.  Alona Bene, MD, [facility]    Jamin Panther, Arlyss Repress, MD 04/05/20 [phone]

## 2020-04-01 NOTE — Discharge Instructions (Addendum)
You were seen in the emergency room today after a fall.  The CT scan of your head and neck were normal.  Please call your primary care doctor to decide if restarting your metoprolol is a good idea.  You have a small puncture wound on your scalp that was too small to closed with staples.  You can clean around this area gently.  If bleeding returns please hold pressure for 15 minutes and try and get it to stop.  It will not stop bleeding or you become confused or unusually sleepy you should return to the emergency department for reevaluation.

## 2020-04-07 DIAGNOSIS — Z1159 Encounter for screening for other viral diseases: Secondary | ICD-10-CM | POA: Diagnosis not present

## 2020-04-07 DIAGNOSIS — Z20828 Contact with and (suspected) exposure to other viral communicable diseases: Secondary | ICD-10-CM | POA: Diagnosis not present

## 2020-04-14 DIAGNOSIS — Z1159 Encounter for screening for other viral diseases: Secondary | ICD-10-CM | POA: Diagnosis not present

## 2020-04-14 DIAGNOSIS — Z20828 Contact with and (suspected) exposure to other viral communicable diseases: Secondary | ICD-10-CM | POA: Diagnosis not present

## 2020-04-21 DIAGNOSIS — Z1159 Encounter for screening for other viral diseases: Secondary | ICD-10-CM | POA: Diagnosis not present

## 2020-04-21 DIAGNOSIS — Z20828 Contact with and (suspected) exposure to other viral communicable diseases: Secondary | ICD-10-CM | POA: Diagnosis not present

## 2020-04-28 DIAGNOSIS — Z20828 Contact with and (suspected) exposure to other viral communicable diseases: Secondary | ICD-10-CM | POA: Diagnosis not present

## 2020-04-28 DIAGNOSIS — Z1159 Encounter for screening for other viral diseases: Secondary | ICD-10-CM | POA: Diagnosis not present

## 2020-05-05 DIAGNOSIS — Z20828 Contact with and (suspected) exposure to other viral communicable diseases: Secondary | ICD-10-CM | POA: Diagnosis not present

## 2020-05-05 DIAGNOSIS — Z1159 Encounter for screening for other viral diseases: Secondary | ICD-10-CM | POA: Diagnosis not present

## 2020-05-19 DIAGNOSIS — Z1159 Encounter for screening for other viral diseases: Secondary | ICD-10-CM | POA: Diagnosis not present

## 2020-05-19 DIAGNOSIS — Z20828 Contact with and (suspected) exposure to other viral communicable diseases: Secondary | ICD-10-CM | POA: Diagnosis not present

## 2020-05-26 DIAGNOSIS — Z20828 Contact with and (suspected) exposure to other viral communicable diseases: Secondary | ICD-10-CM | POA: Diagnosis not present

## 2020-05-26 DIAGNOSIS — Z1159 Encounter for screening for other viral diseases: Secondary | ICD-10-CM | POA: Diagnosis not present

## 2020-06-02 DIAGNOSIS — Z1159 Encounter for screening for other viral diseases: Secondary | ICD-10-CM | POA: Diagnosis not present

## 2020-06-02 DIAGNOSIS — Z20828 Contact with and (suspected) exposure to other viral communicable diseases: Secondary | ICD-10-CM | POA: Diagnosis not present

## 2020-06-09 DIAGNOSIS — Z20828 Contact with and (suspected) exposure to other viral communicable diseases: Secondary | ICD-10-CM | POA: Diagnosis not present

## 2020-06-09 DIAGNOSIS — Z1159 Encounter for screening for other viral diseases: Secondary | ICD-10-CM | POA: Diagnosis not present

## 2020-06-16 DIAGNOSIS — Z20828 Contact with and (suspected) exposure to other viral communicable diseases: Secondary | ICD-10-CM | POA: Diagnosis not present

## 2020-06-16 DIAGNOSIS — Z1159 Encounter for screening for other viral diseases: Secondary | ICD-10-CM | POA: Diagnosis not present

## 2020-06-25 DIAGNOSIS — Z1159 Encounter for screening for other viral diseases: Secondary | ICD-10-CM | POA: Diagnosis not present

## 2020-06-25 DIAGNOSIS — Z20828 Contact with and (suspected) exposure to other viral communicable diseases: Secondary | ICD-10-CM | POA: Diagnosis not present

## 2020-06-29 DIAGNOSIS — Z1159 Encounter for screening for other viral diseases: Secondary | ICD-10-CM | POA: Diagnosis not present

## 2020-06-29 DIAGNOSIS — Z20828 Contact with and (suspected) exposure to other viral communicable diseases: Secondary | ICD-10-CM | POA: Diagnosis not present

## 2020-07-02 DIAGNOSIS — Z20828 Contact with and (suspected) exposure to other viral communicable diseases: Secondary | ICD-10-CM | POA: Diagnosis not present

## 2020-07-02 DIAGNOSIS — Z1159 Encounter for screening for other viral diseases: Secondary | ICD-10-CM | POA: Diagnosis not present

## 2020-07-20 DIAGNOSIS — Z20828 Contact with and (suspected) exposure to other viral communicable diseases: Secondary | ICD-10-CM | POA: Diagnosis not present

## 2020-07-20 DIAGNOSIS — Z1159 Encounter for screening for other viral diseases: Secondary | ICD-10-CM | POA: Diagnosis not present

## 2022-02-06 IMAGING — CT CT CERVICAL SPINE W/O CM
2 series · 13 of 27 positions shown, 16 images · non-contrast
Comparison: None.

CLINICAL DATA: Pain status post fall. Small area of bleeding to the
back of the head.

EXAM:
CT HEAD WITHOUT CONTRAST
CT CERVICAL SPINE WITHOUT CONTRAST
TECHNIQUE: Multidetector CT imaging of the head and cervical spine was
performed following the standard protocol without intravenous
contrast. Multiplanar CT image reconstructions of the cervical spine
were also generated.

[Series 4: c spine soft · axial · 0.31mm/px · z∈[-221,-115]mm · 8 of 63 slices shown, 10 images]
[im 5/63  soft-tissue]
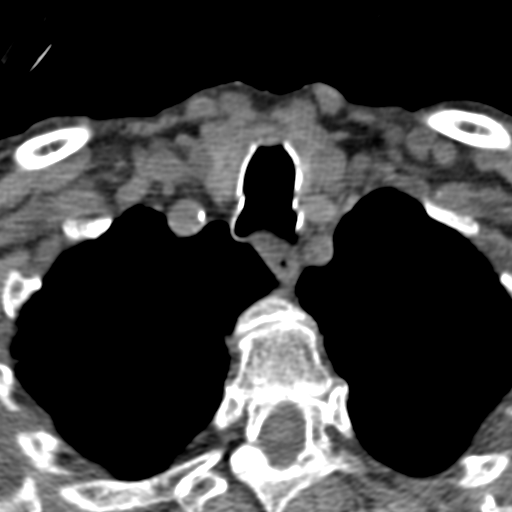
[im 5/63  bone]
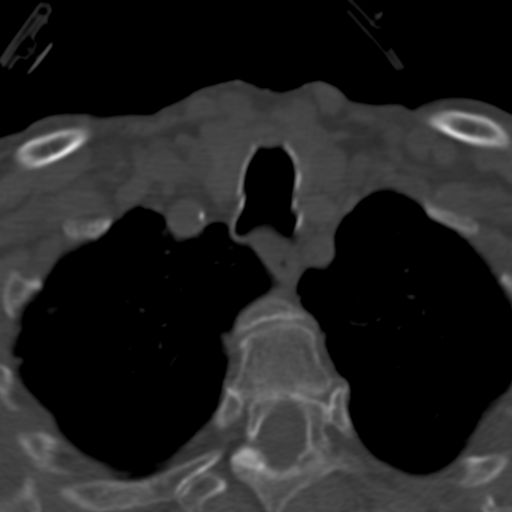
[im 15/63  bone]
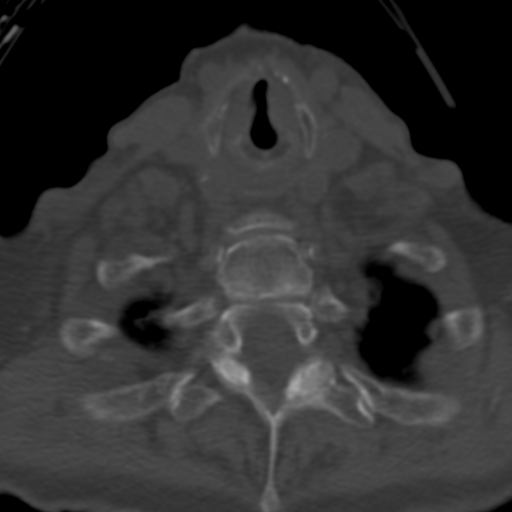
[im 20/63  bone]
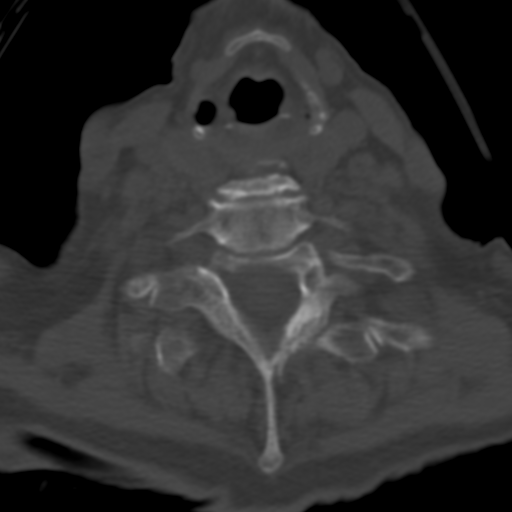
[im 29/63  bone]
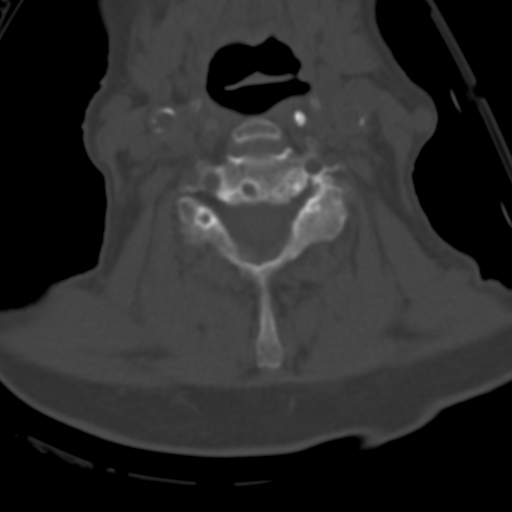
[im 34/63  soft-tissue]
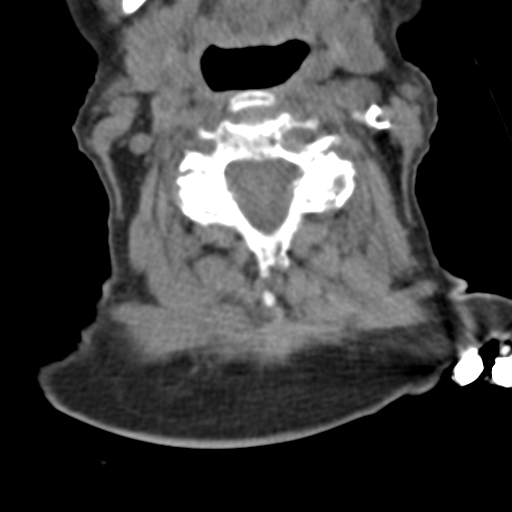
[im 34/63  bone]
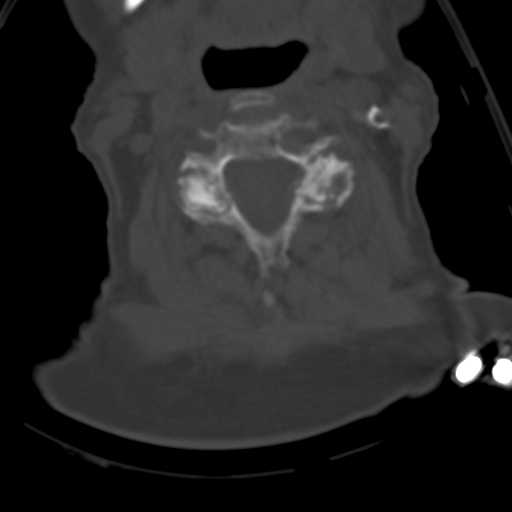
[im 43/63  bone]
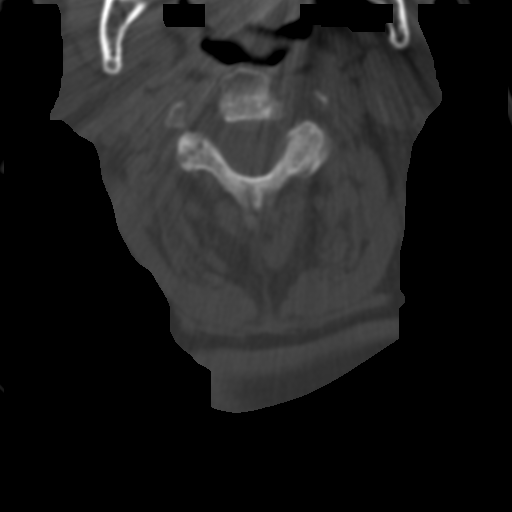
[im 48/63  bone]
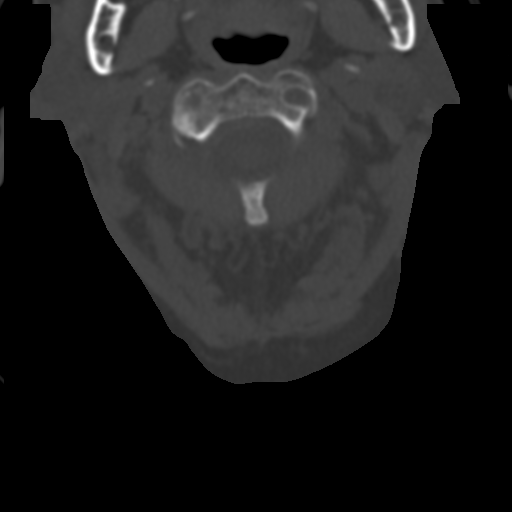
[im 58/63  bone]
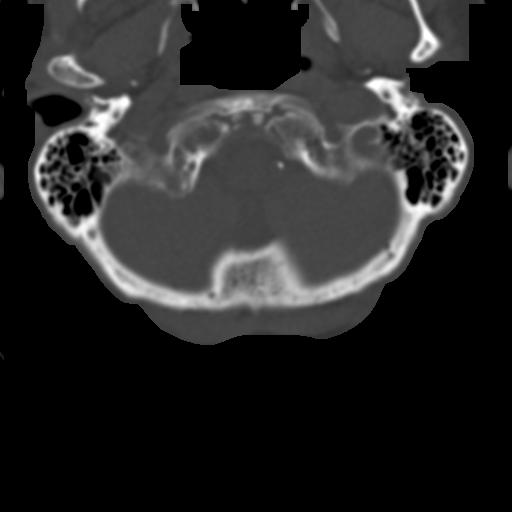

[Series 7: sagittal bone · sagittal · 0.28mm/px · 5 of 47 slices shown, 6 images]
[im 16/47  bone]
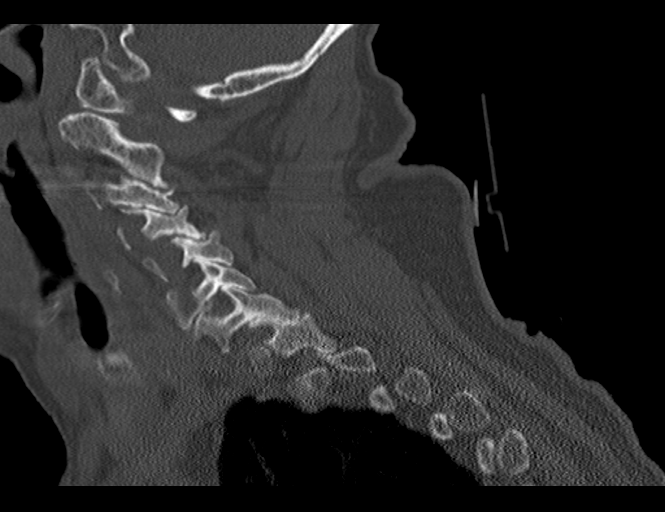
[im 20/47  bone]
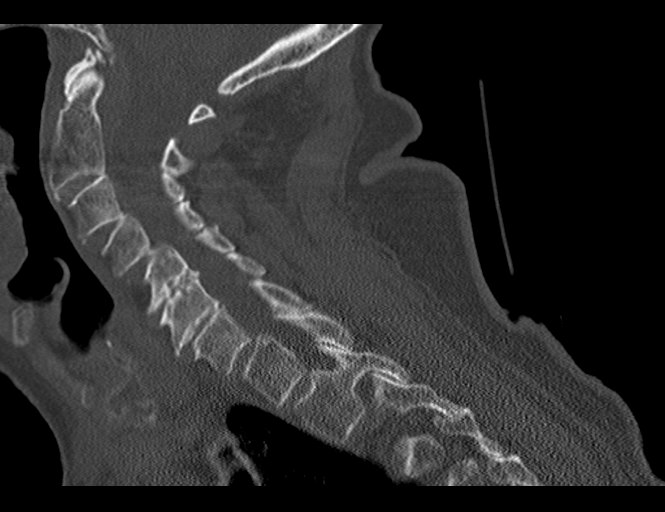
[im 24/47  soft-tissue]
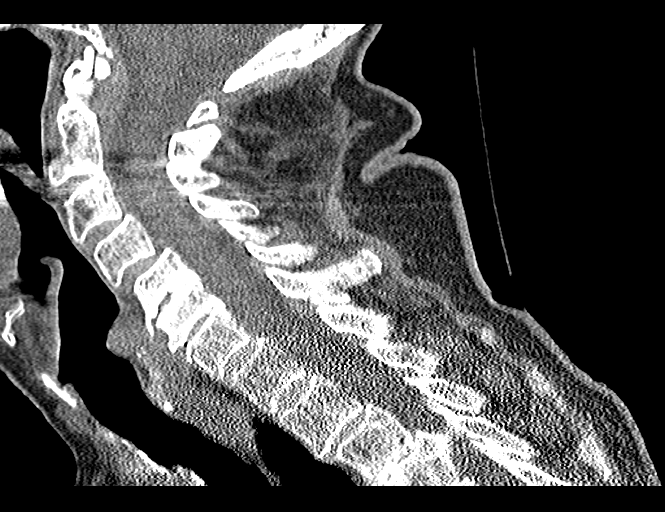
[im 24/47  bone]
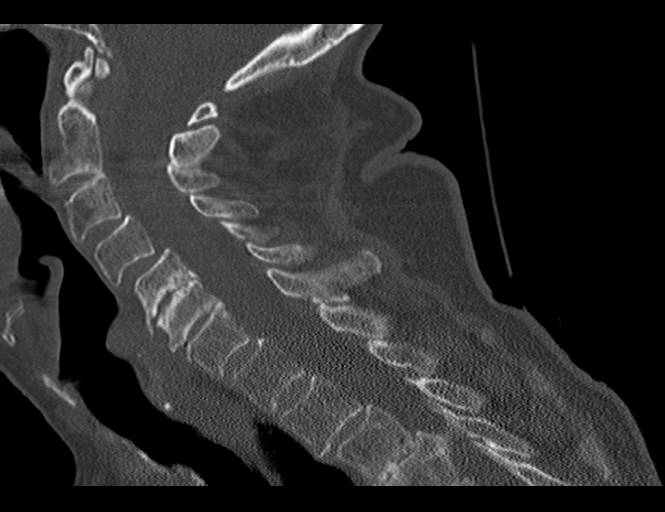
[im 27/47  bone]
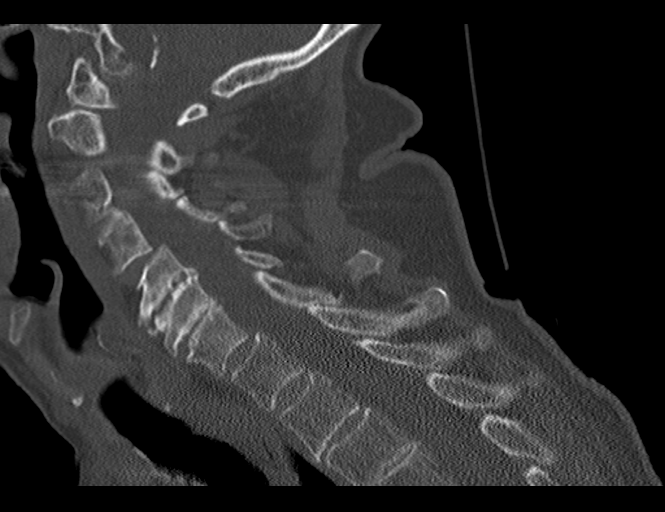
[im 31/47  bone]
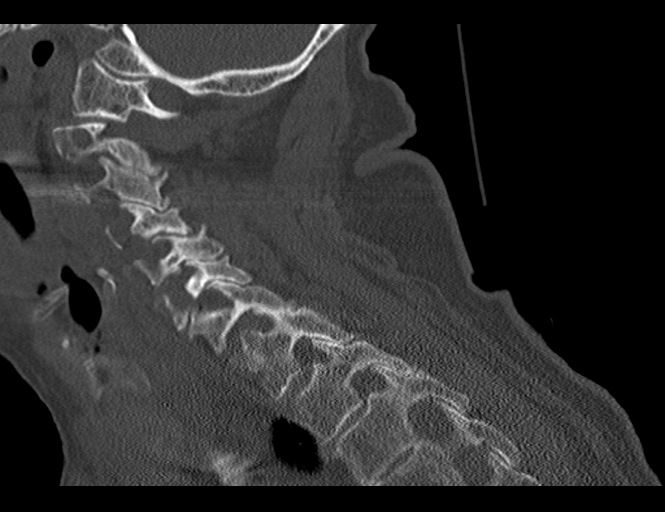

[13 of 27 positions shown; findings below may reference images not displayed]

FINDINGS: CT HEAD FINDINGS

Brain: No hemorrhage. No extraaxial collection.No midline shift. The
ventricular system is unremarkable.There are chronic microvascular
ischemic changes Patchy and confluent areas of decreased attenuation
are noted throughout the deep and periventricular white matter of
the cerebral hemispheres bilaterally, compatible with chronic
microvascular ischemic disease. There is a probable old infarct
involving the left caudate head. The brainstem is unremarkable. The
cerebellum is unremarkable. The sella is unremarkable.

Vascular: No hyperdense vessel or unexpected calcification.

Skull: The calvarium is unremarkable. The skull base is
unremarkable. The visualized upper cervical spine is unremarkable.

Sinuses/Orbits: The visualized orbits are unremarkable. There is an
air-fluid level in the right maxillary sinus. The remaining
paranasal sinuses are essentially clear. The mastoid air cells are
clear.

Other: The visualized parotid gland is unremarkable. There is
posterior scalp swelling on the left.

CT CERVICAL SPINE FINDINGS

Alignment: Normal.

Skull base and vertebrae: No acute fracture. No primary bone lesion
or focal pathologic process.

Soft tissues and spinal canal: No prevertebral fluid or swelling. No
visible canal hematoma.

Disc levels: Multilevel disc height loss is noted, greatest at the
C5-C6 level.

Upper chest: Negative.

Other: None
IMPRESSION: 1. No acute intracranial abnormality.
2. No acute fracture or subluxation of the cervical spine.
3. Posterior scalp swelling on the left.
4. Air-fluid level in the right maxillary sinus. Correlate for
symptoms of sinusitis.
